# Patient Record
Sex: Female | Born: 1980 | Race: White | Hispanic: No | Marital: Married | Smoking: Never smoker
Health system: Southern US, Community
[De-identification: ages and names within clinical notes are randomized; demographics above are authoritative.]

## PROBLEM LIST (undated history)

## (undated) DIAGNOSIS — Z789 Other specified health status: Secondary | ICD-10-CM

## (undated) DIAGNOSIS — N946 Dysmenorrhea, unspecified: Secondary | ICD-10-CM

## (undated) HISTORY — DX: Dysmenorrhea, unspecified: N94.6

## (undated) HISTORY — DX: Other specified health status: Z78.9

---

## 2006-09-05 ENCOUNTER — Other Ambulatory Visit: Admission: RE | Admit: 2006-09-05 | Discharge: 2006-09-05 | Payer: Self-pay | Admitting: Obstetrics and Gynecology

## 2007-03-28 DIAGNOSIS — Z789 Other specified health status: Secondary | ICD-10-CM

## 2007-03-28 HISTORY — DX: Other specified health status: Z78.9

## 2008-03-04 ENCOUNTER — Encounter: Payer: Self-pay | Admitting: Women's Health

## 2008-03-04 ENCOUNTER — Ambulatory Visit: Payer: Self-pay | Admitting: Women's Health

## 2008-03-04 ENCOUNTER — Other Ambulatory Visit: Admission: RE | Admit: 2008-03-04 | Discharge: 2008-03-04 | Payer: Self-pay | Admitting: Obstetrics and Gynecology

## 2008-04-01 ENCOUNTER — Ambulatory Visit: Payer: Self-pay | Admitting: Obstetrics and Gynecology

## 2008-04-23 ENCOUNTER — Ambulatory Visit: Payer: Self-pay | Admitting: Obstetrics and Gynecology

## 2009-04-23 ENCOUNTER — Ambulatory Visit: Payer: Self-pay | Admitting: Women's Health

## 2009-04-23 ENCOUNTER — Other Ambulatory Visit: Admission: RE | Admit: 2009-04-23 | Discharge: 2009-04-23 | Payer: Self-pay | Admitting: Obstetrics and Gynecology

## 2009-11-11 ENCOUNTER — Ambulatory Visit: Payer: Self-pay | Admitting: Women's Health

## 2010-04-01 ENCOUNTER — Ambulatory Visit: Admit: 2010-04-01 | Payer: Self-pay | Admitting: Obstetrics and Gynecology

## 2010-04-06 ENCOUNTER — Ambulatory Visit (HOSPITAL_COMMUNITY)
Admission: RE | Admit: 2010-04-06 | Discharge: 2010-04-06 | Payer: Self-pay | Source: Home / Self Care | Attending: Obstetrics and Gynecology | Admitting: Obstetrics and Gynecology

## 2010-04-18 ENCOUNTER — Ambulatory Visit
Admission: RE | Admit: 2010-04-18 | Discharge: 2010-04-18 | Payer: Self-pay | Source: Home / Self Care | Attending: Obstetrics and Gynecology | Admitting: Obstetrics and Gynecology

## 2010-05-04 ENCOUNTER — Other Ambulatory Visit: Payer: Self-pay | Admitting: Women's Health

## 2010-05-04 ENCOUNTER — Encounter: Payer: Self-pay | Admitting: Women's Health

## 2010-05-04 ENCOUNTER — Other Ambulatory Visit (HOSPITAL_COMMUNITY)
Admission: RE | Admit: 2010-05-04 | Discharge: 2010-05-04 | Disposition: A | Discharge status: Home / Self Care | Payer: BC Managed Care – PPO | Source: Ambulatory Visit | Attending: Obstetrics and Gynecology | Admitting: Obstetrics and Gynecology

## 2010-05-04 DIAGNOSIS — Z01419 Encounter for gynecological examination (general) (routine) without abnormal findings: Secondary | ICD-10-CM

## 2010-05-04 DIAGNOSIS — Z124 Encounter for screening for malignant neoplasm of cervix: Secondary | ICD-10-CM | POA: Insufficient documentation

## 2010-06-06 ENCOUNTER — Ambulatory Visit (INDEPENDENT_AMBULATORY_CARE_PROVIDER_SITE_OTHER): Payer: BC Managed Care – PPO | Admitting: Obstetrics and Gynecology

## 2010-06-06 ENCOUNTER — Other Ambulatory Visit: Payer: BC Managed Care – PPO

## 2010-06-06 DIAGNOSIS — N949 Unspecified condition associated with female genital organs and menstrual cycle: Secondary | ICD-10-CM

## 2010-06-06 DIAGNOSIS — N8300 Follicular cyst of ovary, unspecified side: Secondary | ICD-10-CM

## 2010-06-07 ENCOUNTER — Ambulatory Visit: Payer: BC Managed Care – PPO

## 2010-06-08 ENCOUNTER — Ambulatory Visit (INDEPENDENT_AMBULATORY_CARE_PROVIDER_SITE_OTHER): Payer: BC Managed Care – PPO | Admitting: Obstetrics and Gynecology

## 2010-06-08 DIAGNOSIS — N979 Female infertility, unspecified: Secondary | ICD-10-CM

## 2010-07-06 ENCOUNTER — Ambulatory Visit (INDEPENDENT_AMBULATORY_CARE_PROVIDER_SITE_OTHER): Payer: BC Managed Care – PPO | Admitting: Obstetrics and Gynecology

## 2010-07-06 DIAGNOSIS — N979 Female infertility, unspecified: Secondary | ICD-10-CM

## 2010-07-19 ENCOUNTER — Other Ambulatory Visit (INDEPENDENT_AMBULATORY_CARE_PROVIDER_SITE_OTHER): Payer: BC Managed Care – PPO

## 2010-07-19 DIAGNOSIS — N912 Amenorrhea, unspecified: Secondary | ICD-10-CM

## 2010-07-26 ENCOUNTER — Other Ambulatory Visit (INDEPENDENT_AMBULATORY_CARE_PROVIDER_SITE_OTHER): Payer: BC Managed Care – PPO

## 2010-07-26 DIAGNOSIS — N912 Amenorrhea, unspecified: Secondary | ICD-10-CM

## 2010-08-08 ENCOUNTER — Ambulatory Visit (INDEPENDENT_AMBULATORY_CARE_PROVIDER_SITE_OTHER): Payer: BC Managed Care – PPO | Admitting: Gynecology

## 2010-08-08 DIAGNOSIS — N912 Amenorrhea, unspecified: Secondary | ICD-10-CM

## 2010-08-08 DIAGNOSIS — O200 Threatened abortion: Secondary | ICD-10-CM

## 2010-08-10 ENCOUNTER — Other Ambulatory Visit: Payer: BC Managed Care – PPO

## 2010-08-10 ENCOUNTER — Ambulatory Visit (INDEPENDENT_AMBULATORY_CARE_PROVIDER_SITE_OTHER): Payer: BC Managed Care – PPO | Admitting: Gynecology

## 2010-08-10 DIAGNOSIS — N912 Amenorrhea, unspecified: Secondary | ICD-10-CM

## 2010-08-10 DIAGNOSIS — O9989 Other specified diseases and conditions complicating pregnancy, childbirth and the puerperium: Secondary | ICD-10-CM

## 2010-08-10 DIAGNOSIS — Q519 Congenital malformation of uterus and cervix, unspecified: Secondary | ICD-10-CM

## 2010-08-18 ENCOUNTER — Ambulatory Visit: Payer: BC Managed Care – PPO | Admitting: Obstetrics and Gynecology

## 2010-08-18 ENCOUNTER — Other Ambulatory Visit: Payer: BC Managed Care – PPO

## 2010-08-24 LAB — HEPATITIS B SURFACE ANTIGEN: Hepatitis B Surface Ag: NEGATIVE

## 2010-08-24 LAB — ABO/RH: RH Type: POSITIVE

## 2010-09-02 LAB — GC/CHLAMYDIA PROBE AMP, GENITAL: Gonorrhea: NEGATIVE

## 2010-12-02 ENCOUNTER — Other Ambulatory Visit (HOSPITAL_COMMUNITY): Payer: Self-pay | Admitting: Obstetrics & Gynecology

## 2010-12-02 DIAGNOSIS — R1011 Right upper quadrant pain: Secondary | ICD-10-CM

## 2010-12-02 DIAGNOSIS — K8020 Calculus of gallbladder without cholecystitis without obstruction: Secondary | ICD-10-CM

## 2010-12-05 ENCOUNTER — Ambulatory Visit (HOSPITAL_COMMUNITY)
Admission: RE | Admit: 2010-12-05 | Discharge: 2010-12-05 | Disposition: A | Discharge status: Home / Self Care | Payer: BC Managed Care – PPO | Source: Ambulatory Visit | Attending: Obstetrics & Gynecology | Admitting: Obstetrics & Gynecology

## 2010-12-05 DIAGNOSIS — O99891 Other specified diseases and conditions complicating pregnancy: Secondary | ICD-10-CM | POA: Insufficient documentation

## 2010-12-05 DIAGNOSIS — R1011 Right upper quadrant pain: Secondary | ICD-10-CM | POA: Insufficient documentation

## 2010-12-05 DIAGNOSIS — K8020 Calculus of gallbladder without cholecystitis without obstruction: Secondary | ICD-10-CM

## 2010-12-06 ENCOUNTER — Other Ambulatory Visit (HOSPITAL_COMMUNITY): Payer: Self-pay | Admitting: Obstetrics & Gynecology

## 2010-12-06 DIAGNOSIS — O2690 Pregnancy related conditions, unspecified, unspecified trimester: Secondary | ICD-10-CM

## 2010-12-09 ENCOUNTER — Other Ambulatory Visit (HOSPITAL_COMMUNITY): Payer: BC Managed Care – PPO

## 2010-12-13 ENCOUNTER — Ambulatory Visit (HOSPITAL_COMMUNITY)
Admission: RE | Admit: 2010-12-13 | Discharge: 2010-12-13 | Disposition: A | Discharge status: Home / Self Care | Payer: BC Managed Care – PPO | Source: Ambulatory Visit | Attending: Obstetrics & Gynecology | Admitting: Obstetrics & Gynecology

## 2010-12-13 ENCOUNTER — Encounter (HOSPITAL_COMMUNITY): Payer: Self-pay

## 2010-12-13 DIAGNOSIS — Z1389 Encounter for screening for other disorder: Secondary | ICD-10-CM | POA: Insufficient documentation

## 2010-12-13 DIAGNOSIS — O2690 Pregnancy related conditions, unspecified, unspecified trimester: Secondary | ICD-10-CM

## 2010-12-13 DIAGNOSIS — O358XX0 Maternal care for other (suspected) fetal abnormality and damage, not applicable or unspecified: Secondary | ICD-10-CM | POA: Insufficient documentation

## 2010-12-13 DIAGNOSIS — Z363 Encounter for antenatal screening for malformations: Secondary | ICD-10-CM | POA: Insufficient documentation

## 2010-12-13 NOTE — Progress Notes (Signed)
Patient seen for ultrasound appointment today.  Please see [facility]OB[facility] report for details.  

## 2010-12-14 NOTE — Progress Notes (Addendum)
Genetic Counseling  High-Risk Gestation Note  Appointment Date:  12/13/2010 Referred By: Robley Fries, MD Date of Birth:  1980-08-03 Partner:  Christina Kidd    Pregnancy History: G1P0000 Estimated Date of Delivery: 03/29/11 Estimated Gestational Age: [redacted]w[redacted]d  Mrs. Christina Kidd and her husband, Mr. 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   Both family histories were reviewed and found to be contributory  for Saethre-Chotzen syndrome.  The current pregnancy was conceived with donor sperm. 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 Information regarding this child's features was not available at the time of today's visit. The donor was reportedly also tested and found to have the same mutation. According to the report the couple received, the donor "is healthy with no history of congenital defects and no visible deformities of his skull, face, fingers, or toes."  The family history was otherwise unremarkable for birth defects, mental retardation, recurrent pregnancy loss, and known genetic conditions. Further genetic counseling is warranted if more information is obtained.  Saethre Chotzen syndrome (SCS) is a genetic craniosynostosis condition with a prevalence of approximately 1 in 25,000-50,000. This particular condition may include the features of coronal synostosis (unilateral or bilateral), possibly leading to facial asymmetry and a high forehead. Many have a low frontal hairline, ptosis, widely spaced eyes, small ears, and findings of the hands and feet  including fusion of the skin between the second and third fingers and/or a broad or duplicated great toe. Less common signs of this condition include short  stature, abnormalities of the vertebra, hearing loss, and heart defects. Although most people with this condition are of normal intelligence, delayed development and learning difficulties have been reported but more so in individuals with SCS due to a gene deletion rather than intragenic mutations. Surgery to correct the fusion of the cranial sutures may be needed to prevent facial asymmetry and to prevent increased intracranial pressure. Features of SCS are variable, even within families. Incomplete penetrance has been described, where individuals with a disease causing mutation do not necessarily manifest features of the condition.      Saethre-Chotzen syndrome is typically diagnosed based on clinical findings. Mutations in the TWIST1 gene have been associated with SCS, with approximately 46%-80% of individuals with clinical features of SCS having an identifiable mutation in this gene. Many individuals with SCS also have a parent with the condition, though the condition can occur sporadically and features in an affected parent can vary from that of an affected child. SCS follows autosomal dominant inheritance, where an individual with Saethre-Chotzen syndrome has a 50% (1 in 2) chance of passing on the nonworking gene change to each offspring, thus leading to the condition. Given that the donor is reported to have a TWIST mutation, the current pregnancy has a 50% chance to have inherited this mutation and thus Saethre-Chotzen syndrome.  It was reported that the identified TWIST mutation (S140A) in the sperm donor has not been previously reported in the literature. Given this information and given that we do not have information regarding the features of the reported affected child, we are unable to predict phenotype in the case of an affected pregnancy.   We discussed the option of prenatal diagnosis via amniocentesis. The risks, benefits, and limitations  of this procedure were discussed. A risk of 1 in  200-300 was given for amniocentesis, the primary complication being spontaneous pregnancy loss. The couple declined amniocentesis, stating that they were not interested in this test given the associated risk of complications. We discussed the screening option of targeted ultrasound in the pregnancy to assess for features of craniosynostosis, such as cranial shape and spacing of the eyes. However, ultrasound cannot diagnose or rule out the presence of craniosynostosis in a pregnancy, nor does ultrasound detect all birth defects prenatally.  They understand that although the ultrasound may appear normal, the risk of anomalies cannot be completely eliminated. We discussed the option of postnatal genetic testing for the TWIST mutation and postnatal evaluation by a medical geneticist, and the couple expressed interest in this option. We discussed that Dr. Lendon Colonel is a medical geneticist with [facility] in [place] (phone: [phone] for appointments). Postnatal testing and evaluation may occur in the neonatal period in the hospital following delivery, or evaluation and testing can be performed in an outpatient setting at a later date.   We discussed that if the couple elected to pursue a future pregnancy using the same donor sperm, recurrence risk for SCS would be 50%, and prenatal diagnosis would be available via chorionic villus sampling or amniocentesis in a future pregnancy. Additionally, preimplantation genetic diagnosis is also an option for a future pregnancy conceived with the same donor sperm, meaning that embryo(s) obtained through in vitro fertilization would be tested for the S140A TWIST mutation prior to the implantation of unaffected embryo(s) into the uterus. Alternatively, we discussed that the couple could elect to pursue a future pregnancy with different donor sperm.   The patient denied exposure to environmental toxins or chemical agents.  She denied the use of alcohol,  tobacco or street drugs.  She denied significant viral illnesses during the course of her pregnancy.  Her medical and surgical history were noncontributory.    We discussed cystic fibrosis (CF) including: the features of CF, the incidence of 1 in 3300 in the Caucasian population, autosomal recessive inheritance, and the 25% chance of having a baby with CF if both parents are carriers of CF.  We also discussed the option of carrier testing including the pros and cons of carrier testing, as well as the option of prenatal testing if needed. Additionally, CF is included on the newborn screening panel in [place]. Mrs. Lusignan declined CF carrier screening at this time.   A complete obstetrical ultrasound was performed at the time of today's evaluation.  The ultrasound report is reported separately.     We counseled the patient for approximately 30 minutes regarding the above risks and available options.     Clydie Braun Kayshawn Ozburn, MS, [facility] 12/14/2010

## 2011-01-18 ENCOUNTER — Ambulatory Visit (HOSPITAL_COMMUNITY)
Admission: RE | Admit: 2011-01-18 | Discharge: 2011-01-18 | Disposition: A | Discharge status: Home / Self Care | Payer: BC Managed Care – PPO | Source: Ambulatory Visit | Attending: Obstetrics & Gynecology | Admitting: Obstetrics & Gynecology

## 2011-01-18 DIAGNOSIS — Z3689 Encounter for other specified antenatal screening: Secondary | ICD-10-CM | POA: Insufficient documentation

## 2011-01-18 DIAGNOSIS — O2690 Pregnancy related conditions, unspecified, unspecified trimester: Secondary | ICD-10-CM

## 2011-02-28 LAB — STREP B DNA PROBE: GBS: NEGATIVE

## 2011-03-18 ENCOUNTER — Encounter (HOSPITAL_COMMUNITY): Payer: Self-pay | Admitting: Obstetrics and Gynecology

## 2011-03-18 ENCOUNTER — Inpatient Hospital Stay (HOSPITAL_COMMUNITY)
Admission: AD | Admit: 2011-03-18 | Discharge: 2011-03-20 | DRG: 370 | Disposition: A | Discharge status: Home / Self Care | Payer: BC Managed Care – PPO | Source: Ambulatory Visit | Attending: Obstetrics and Gynecology | Admitting: Obstetrics and Gynecology

## 2011-03-18 ENCOUNTER — Inpatient Hospital Stay (HOSPITAL_COMMUNITY): Payer: BC Managed Care – PPO | Admitting: Anesthesiology

## 2011-03-18 ENCOUNTER — Encounter (HOSPITAL_COMMUNITY): Payer: Self-pay | Admitting: *Deleted

## 2011-03-18 ENCOUNTER — Encounter (HOSPITAL_COMMUNITY): Payer: Self-pay | Admitting: Anesthesiology

## 2011-03-18 ENCOUNTER — Encounter (HOSPITAL_COMMUNITY)
Admission: AD | Disposition: A | Discharge status: Home / Self Care | Payer: Self-pay | Source: Ambulatory Visit | Attending: Obstetrics and Gynecology

## 2011-03-18 ENCOUNTER — Other Ambulatory Visit: Payer: Self-pay | Admitting: Obstetrics & Gynecology

## 2011-03-18 DIAGNOSIS — O9903 Anemia complicating the puerperium: Secondary | ICD-10-CM | POA: Diagnosis not present

## 2011-03-18 DIAGNOSIS — D649 Anemia, unspecified: Secondary | ICD-10-CM | POA: Diagnosis not present

## 2011-03-18 DIAGNOSIS — IMO0001 Reserved for inherently not codable concepts without codable children: Secondary | ICD-10-CM

## 2011-03-18 LAB — CBC
MCH: 33.2 pg (ref 26.0–34.0)
MCHC: 35.3 g/dL (ref 30.0–36.0)
MCV: 94.2 fL (ref 78.0–100.0)
Platelets: 211 10*3/uL (ref 150–400)
RBC: 3.64 MIL/uL — ABNORMAL LOW (ref 3.87–5.11)

## 2011-03-18 LAB — RPR: RPR Ser Ql: NONREACTIVE

## 2011-03-18 SURGERY — Surgical Case
Anesthesia: Regional | Site: Abdomen | Wound class: Clean Contaminated

## 2011-03-18 MED ORDER — OXYTOCIN 20 UNITS IN LACTATED RINGERS INFUSION - SIMPLE: 125.0000 mL/h | INTRAVENOUS | Status: AC

## 2011-03-18 MED ORDER — MEPERIDINE HCL 25 MG/ML IJ SOLN
INTRAMUSCULAR | Status: DC | PRN
  Administered 2011-03-18 (×3): 6 mg via INTRAVENOUS
  Administered 2011-03-18: 7 mg via INTRAVENOUS

## 2011-03-18 MED ORDER — CEFAZOLIN SODIUM 1-5 GM-% IV SOLN
INTRAVENOUS | Status: AC
  Filled 2011-03-18: qty 50

## 2011-03-18 MED ORDER — FENTANYL CITRATE 0.05 MG/ML IJ SOLN: 25.0000 ug | INTRAMUSCULAR | Status: DC | PRN

## 2011-03-18 MED ORDER — METOCLOPRAMIDE HCL 5 MG/ML IJ SOLN: 10.0000 mg | Freq: Once | INTRAMUSCULAR | Status: DC | PRN

## 2011-03-18 MED ORDER — LIDOCAINE HCL (PF) 1 % IJ SOLN: 30.0000 mL | INTRAMUSCULAR | Status: DC | PRN

## 2011-03-18 MED ORDER — SODIUM CHLORIDE 0.9 % IJ SOLN: 3.0000 mL | Freq: Two times a day (BID) | INTRAMUSCULAR | Status: DC

## 2011-03-18 MED ORDER — IBUPROFEN 600 MG PO TABS
600.0000 mg | ORAL_TABLET | Freq: Four times a day (QID) | ORAL | Status: DC
  Administered 2011-03-19 – 2011-03-20 (×5): 600 mg via ORAL
  Filled 2011-03-18 (×2): qty 1

## 2011-03-18 MED ORDER — OXYTOCIN 10 UNIT/ML IJ SOLN: 10.0000 [IU] | Freq: Once | INTRAMUSCULAR | Status: DC

## 2011-03-18 MED ORDER — SODIUM CHLORIDE 0.9 % IR SOLN
Status: DC | PRN
  Administered 2011-03-18: 1000 mL

## 2011-03-18 MED ORDER — ONDANSETRON HCL 4 MG/2ML IJ SOLN
INTRAMUSCULAR | Status: DC | PRN
  Administered 2011-03-18: 4 mg via INTRAVENOUS

## 2011-03-18 MED ORDER — SIMETHICONE 80 MG PO CHEW
80.0000 mg | CHEWABLE_TABLET | Freq: Three times a day (TID) | ORAL | Status: DC
  Administered 2011-03-19 – 2011-03-20 (×6): 80 mg via ORAL

## 2011-03-18 MED ORDER — LIDOCAINE-EPINEPHRINE (PF) 2 %-1:200000 IJ SOLN
INTRAMUSCULAR | Status: AC
  Filled 2011-03-18: qty 20

## 2011-03-18 MED ORDER — LACTATED RINGERS IV SOLN
500.0000 mL | Freq: Once | INTRAVENOUS | Status: AC
  Administered 2011-03-18: 500 mL via INTRAVENOUS

## 2011-03-18 MED ORDER — DIBUCAINE 1 % RE OINT: 1.0000 "application " | TOPICAL_OINTMENT | RECTAL | Status: DC | PRN

## 2011-03-18 MED ORDER — DIPHENHYDRAMINE HCL 50 MG/ML IJ SOLN: 12.5000 mg | INTRAMUSCULAR | Status: DC | PRN

## 2011-03-18 MED ORDER — TERBUTALINE SULFATE 1 MG/ML IJ SOLN: 0.2500 mg | Freq: Once | INTRAMUSCULAR | Status: DC | PRN

## 2011-03-18 MED ORDER — NALBUPHINE HCL 10 MG/ML IJ SOLN
5.0000 mg | INTRAMUSCULAR | Status: DC | PRN
  Filled 2011-03-18: qty 1

## 2011-03-18 MED ORDER — ONDANSETRON HCL 4 MG/2ML IJ SOLN
INTRAMUSCULAR | Status: AC
  Filled 2011-03-18: qty 2

## 2011-03-18 MED ORDER — SIMETHICONE 80 MG PO CHEW: 80.0000 mg | CHEWABLE_TABLET | ORAL | Status: DC | PRN

## 2011-03-18 MED ORDER — EPHEDRINE 5 MG/ML INJ
10.0000 mg | INTRAVENOUS | Status: DC | PRN
  Administered 2011-03-18: 10 mg via INTRAVENOUS
  Filled 2011-03-18: qty 4

## 2011-03-18 MED ORDER — DIPHENHYDRAMINE HCL 25 MG PO CAPS: 25.0000 mg | ORAL_CAPSULE | Freq: Four times a day (QID) | ORAL | Status: DC | PRN

## 2011-03-18 MED ORDER — MORPHINE SULFATE (PF) 0.5 MG/ML IJ SOLN
INTRAMUSCULAR | Status: DC | PRN
  Administered 2011-03-18: 1 mg via INTRAVENOUS

## 2011-03-18 MED ORDER — CEFAZOLIN SODIUM 1-5 GM-% IV SOLN
INTRAVENOUS | Status: DC | PRN
  Administered 2011-03-18: 1 g via INTRAVENOUS

## 2011-03-18 MED ORDER — ZOLPIDEM TARTRATE 5 MG PO TABS: 5.0000 mg | ORAL_TABLET | Freq: Every evening | ORAL | Status: DC | PRN

## 2011-03-18 MED ORDER — CEFAZOLIN SODIUM 1-5 GM-% IV SOLN: 1.0000 g | INTRAVENOUS | Status: DC

## 2011-03-18 MED ORDER — ONDANSETRON HCL 4 MG/2ML IJ SOLN
4.0000 mg | Freq: Three times a day (TID) | INTRAMUSCULAR | Status: DC | PRN
  Administered 2011-03-19: 4 mg via INTRAVENOUS

## 2011-03-18 MED ORDER — LACTATED RINGERS IV SOLN: 500.0000 mL | INTRAVENOUS | Status: DC | PRN

## 2011-03-18 MED ORDER — WITCH HAZEL-GLYCERIN EX PADS: 1.0000 "application " | MEDICATED_PAD | CUTANEOUS | Status: DC | PRN

## 2011-03-18 MED ORDER — SODIUM CHLORIDE 0.9 % IJ SOLN: 3.0000 mL | INTRAMUSCULAR | Status: DC | PRN

## 2011-03-18 MED ORDER — SODIUM BICARBONATE 8.4 % IV SOLN
INTRAVENOUS | Status: DC | PRN
  Administered 2011-03-18: 5 mL via EPIDURAL

## 2011-03-18 MED ORDER — RISAQUAD PO CAPS
1.0000 | ORAL_CAPSULE | Freq: Every day | ORAL | Status: DC
  Administered 2011-03-18: 1 via ORAL
  Filled 2011-03-18 (×2): qty 1

## 2011-03-18 MED ORDER — OXYCODONE-ACETAMINOPHEN 5-325 MG PO TABS: 2.0000 | ORAL_TABLET | ORAL | Status: DC | PRN

## 2011-03-18 MED ORDER — MORPHINE SULFATE (PF) 0.5 MG/ML IJ SOLN
INTRAMUSCULAR | Status: DC | PRN
  Administered 2011-03-18: 4 mg via EPIDURAL

## 2011-03-18 MED ORDER — SODIUM CHLORIDE 0.9 % IV SOLN
1.0000 ug/kg/h | INTRAVENOUS | Status: DC | PRN
  Filled 2011-03-18: qty 2.5

## 2011-03-18 MED ORDER — NALOXONE HCL 0.4 MG/ML IJ SOLN: 0.4000 mg | INTRAMUSCULAR | Status: DC | PRN

## 2011-03-18 MED ORDER — ONDANSETRON HCL 4 MG/2ML IJ SOLN: 4.0000 mg | Freq: Four times a day (QID) | INTRAMUSCULAR | Status: DC | PRN

## 2011-03-18 MED ORDER — IBUPROFEN 600 MG PO TABS
600.0000 mg | ORAL_TABLET | Freq: Four times a day (QID) | ORAL | Status: DC | PRN
  Filled 2011-03-18 (×3): qty 1

## 2011-03-18 MED ORDER — OXYTOCIN 20 UNITS IN LACTATED RINGERS INFUSION - SIMPLE
1.0000 m[IU]/min | INTRAVENOUS | Status: DC
  Administered 2011-03-18: 1 m[IU]/min via INTRAVENOUS

## 2011-03-18 MED ORDER — LACTATED RINGERS IV SOLN
INTRAVENOUS | Status: DC | PRN
  Administered 2011-03-18 (×3): via INTRAVENOUS

## 2011-03-18 MED ORDER — FENTANYL 2.5 MCG/ML BUPIVACAINE 1/10 % EPIDURAL INFUSION (WH - ANES)
INTRAMUSCULAR | Status: DC | PRN
  Administered 2011-03-18: 14 mL/h via EPIDURAL

## 2011-03-18 MED ORDER — PHENYLEPHRINE 40 MCG/ML (10ML) SYRINGE FOR IV PUSH (FOR BLOOD PRESSURE SUPPORT)
80.0000 ug | PREFILLED_SYRINGE | INTRAVENOUS | Status: DC | PRN
  Filled 2011-03-18: qty 5

## 2011-03-18 MED ORDER — MORPHINE SULFATE 0.5 MG/ML IJ SOLN
INTRAMUSCULAR | Status: AC
  Filled 2011-03-18: qty 10

## 2011-03-18 MED ORDER — LACTATED RINGERS IV SOLN
INTRAVENOUS | Status: DC
  Administered 2011-03-18: 16:00:00 via INTRAVENOUS

## 2011-03-18 MED ORDER — FLEET ENEMA 7-19 GM/118ML RE ENEM: 1.0000 | ENEMA | RECTAL | Status: DC | PRN

## 2011-03-18 MED ORDER — LIDOCAINE HCL 1.5 % IJ SOLN
INTRAMUSCULAR | Status: DC | PRN
  Administered 2011-03-18 (×2): 4 mL via EPIDURAL

## 2011-03-18 MED ORDER — MEPERIDINE HCL 25 MG/ML IJ SOLN: 6.2500 mg | INTRAMUSCULAR | Status: DC | PRN

## 2011-03-18 MED ORDER — KETOROLAC TROMETHAMINE 30 MG/ML IJ SOLN: 30.0000 mg | Freq: Four times a day (QID) | INTRAMUSCULAR | Status: AC | PRN

## 2011-03-18 MED ORDER — ONDANSETRON HCL 4 MG PO TABS: 4.0000 mg | ORAL_TABLET | ORAL | Status: DC | PRN

## 2011-03-18 MED ORDER — SODIUM CHLORIDE 0.9 % IV SOLN: 250.0000 mL | INTRAVENOUS | Status: DC | PRN

## 2011-03-18 MED ORDER — PHENYLEPHRINE 40 MCG/ML (10ML) SYRINGE FOR IV PUSH (FOR BLOOD PRESSURE SUPPORT): 80.0000 ug | PREFILLED_SYRINGE | INTRAVENOUS | Status: DC | PRN

## 2011-03-18 MED ORDER — CITRIC ACID-SODIUM CITRATE 334-500 MG/5ML PO SOLN
30.0000 mL | ORAL | Status: DC | PRN
  Administered 2011-03-18: 30 mL via ORAL
  Filled 2011-03-18: qty 15

## 2011-03-18 MED ORDER — OXYTOCIN 20 UNITS IN LACTATED RINGERS INFUSION - SIMPLE
INTRAVENOUS | Status: DC | PRN
  Administered 2011-03-18 (×2): 20 [IU] via INTRAVENOUS

## 2011-03-18 MED ORDER — DIPHENHYDRAMINE HCL 25 MG PO CAPS: 25.0000 mg | ORAL_CAPSULE | ORAL | Status: DC | PRN

## 2011-03-18 MED ORDER — OXYTOCIN 20 UNITS IN LACTATED RINGERS INFUSION - SIMPLE: 125.0000 mL/h | Freq: Once | INTRAVENOUS | Status: DC

## 2011-03-18 MED ORDER — MEPERIDINE HCL 25 MG/ML IJ SOLN
INTRAMUSCULAR | Status: AC
  Filled 2011-03-18: qty 1

## 2011-03-18 MED ORDER — PANTOPRAZOLE SODIUM 20 MG PO TBEC
20.0000 mg | DELAYED_RELEASE_TABLET | Freq: Every day | ORAL | Status: DC
  Administered 2011-03-18: 20 mg via ORAL
  Filled 2011-03-18 (×2): qty 1

## 2011-03-18 MED ORDER — DIPHENHYDRAMINE HCL 50 MG/ML IJ SOLN: 25.0000 mg | INTRAMUSCULAR | Status: DC | PRN

## 2011-03-18 MED ORDER — OXYCODONE-ACETAMINOPHEN 5-325 MG PO TABS
1.0000 | ORAL_TABLET | ORAL | Status: DC | PRN
  Administered 2011-03-20: 1 via ORAL
  Filled 2011-03-18 (×2): qty 1

## 2011-03-18 MED ORDER — KETOROLAC TROMETHAMINE 30 MG/ML IJ SOLN
INTRAMUSCULAR | Status: AC
  Filled 2011-03-18: qty 1

## 2011-03-18 MED ORDER — ACETAMINOPHEN 325 MG PO TABS: 650.0000 mg | ORAL_TABLET | ORAL | Status: DC | PRN

## 2011-03-18 MED ORDER — PRENATAL MULTIVITAMIN CH
1.0000 | ORAL_TABLET | Freq: Every day | ORAL | Status: DC
  Administered 2011-03-19 – 2011-03-20 (×2): 1 via ORAL
  Filled 2011-03-18 (×2): qty 1

## 2011-03-18 MED ORDER — SODIUM BICARBONATE 8.4 % IV SOLN
INTRAVENOUS | Status: AC
  Filled 2011-03-18: qty 50

## 2011-03-18 MED ORDER — MENTHOL 3 MG MT LOZG: 1.0000 | LOZENGE | OROMUCOSAL | Status: DC | PRN

## 2011-03-18 MED ORDER — KETOROLAC TROMETHAMINE 60 MG/2ML IM SOLN
INTRAMUSCULAR | Status: AC
  Filled 2011-03-18: qty 2

## 2011-03-18 MED ORDER — LANOLIN HYDROUS EX OINT: 1.0000 "application " | TOPICAL_OINTMENT | CUTANEOUS | Status: DC | PRN

## 2011-03-18 MED ORDER — OXYTOCIN BOLUS FROM INFUSION
500.0000 mL | Freq: Once | INTRAVENOUS | Status: DC
  Filled 2011-03-18: qty 1000
  Filled 2011-03-18: qty 500

## 2011-03-18 MED ORDER — OXYTOCIN 10 UNIT/ML IJ SOLN
INTRAMUSCULAR | Status: AC
  Filled 2011-03-18: qty 4

## 2011-03-18 MED ORDER — PANTOPRAZOLE SODIUM 20 MG PO TBEC
20.0000 mg | DELAYED_RELEASE_TABLET | Freq: Every day | ORAL | Status: DC
  Administered 2011-03-20: 20 mg via ORAL
  Filled 2011-03-18 (×4): qty 1

## 2011-03-18 MED ORDER — EPHEDRINE 5 MG/ML INJ: 10.0000 mg | INTRAVENOUS | Status: DC | PRN

## 2011-03-18 MED ORDER — IBUPROFEN 600 MG PO TABS: 600.0000 mg | ORAL_TABLET | Freq: Four times a day (QID) | ORAL | Status: DC | PRN

## 2011-03-18 MED ORDER — ONDANSETRON HCL 4 MG/2ML IJ SOLN
4.0000 mg | INTRAMUSCULAR | Status: DC | PRN
  Filled 2011-03-18: qty 2

## 2011-03-18 MED ORDER — FENTANYL 2.5 MCG/ML BUPIVACAINE 1/10 % EPIDURAL INFUSION (WH - ANES)
14.0000 mL/h | INTRAMUSCULAR | Status: DC
  Filled 2011-03-18: qty 60

## 2011-03-18 MED ORDER — LACTATED RINGERS IV SOLN: INTRAVENOUS | Status: DC

## 2011-03-18 MED ORDER — METOCLOPRAMIDE HCL 5 MG/ML IJ SOLN: 10.0000 mg | Freq: Three times a day (TID) | INTRAMUSCULAR | Status: DC | PRN

## 2011-03-18 MED ORDER — SCOPOLAMINE 1 MG/3DAYS TD PT72
1.0000 | MEDICATED_PATCH | Freq: Once | TRANSDERMAL | Status: DC
  Filled 2011-03-18: qty 1

## 2011-03-18 MED ORDER — KETOROLAC TROMETHAMINE 30 MG/ML IJ SOLN
30.0000 mg | Freq: Four times a day (QID) | INTRAMUSCULAR | Status: AC | PRN
  Administered 2011-03-18: 30 mg via INTRAMUSCULAR

## 2011-03-18 MED ORDER — TETANUS-DIPHTH-ACELL PERTUSSIS 5-2.5-18.5 LF-MCG/0.5 IM SUSP
0.5000 mL | Freq: Once | INTRAMUSCULAR | Status: AC
  Administered 2011-03-19: 0.5 mL via INTRAMUSCULAR
  Filled 2011-03-18: qty 0.5

## 2011-03-18 MED ORDER — SENNOSIDES-DOCUSATE SODIUM 8.6-50 MG PO TABS: 2.0000 | ORAL_TABLET | Freq: Every day | ORAL | Status: DC

## 2011-03-18 SURGICAL SUPPLY — 41 items
APL SKNCLS STERI-STRIP NONHPOA (GAUZE/BANDAGES/DRESSINGS) ×1
BENZOIN TINCTURE PRP APPL 2/3 (GAUZE/BANDAGES/DRESSINGS) ×1 IMPLANT
CHLORAPREP W/TINT 26ML (MISCELLANEOUS) ×2 IMPLANT
CLOTH BEACON ORANGE TIMEOUT ST (SAFETY) ×2 IMPLANT
CONTAINER PREFILL 10% NBF 15ML (MISCELLANEOUS) IMPLANT
DRESSING TELFA 8X3 (GAUZE/BANDAGES/DRESSINGS) ×2 IMPLANT
DRSG COVADERM 4X10 (GAUZE/BANDAGES/DRESSINGS) ×1 IMPLANT
ELECT REM PT RETURN 9FT ADLT (ELECTROSURGICAL) ×2
ELECTRODE REM PT RTRN 9FT ADLT (ELECTROSURGICAL) ×1 IMPLANT
EXTRACTOR VACUUM KIWI (MISCELLANEOUS) IMPLANT
EXTRACTOR VACUUM M CUP 4 TUBE (SUCTIONS) IMPLANT
GAUZE SPONGE 4X4 12PLY STRL LF (GAUZE/BANDAGES/DRESSINGS) ×2 IMPLANT
GLOVE BIO SURGEON STRL SZ7 (GLOVE) ×2 IMPLANT
GLOVE BIOGEL PI IND STRL 7.0 (GLOVE) ×2 IMPLANT
GLOVE BIOGEL PI INDICATOR 7.0 (GLOVE) ×2
GOWN PREVENTION PLUS LG XLONG (DISPOSABLE) ×6 IMPLANT
KIT ABG SYR 3ML LUER SLIP (SYRINGE) IMPLANT
NDL HYPO 25X5/8 SAFETYGLIDE (NEEDLE) IMPLANT
NEEDLE HYPO 25X5/8 SAFETYGLIDE (NEEDLE) IMPLANT
NS IRRIG 1000ML POUR BTL (IV SOLUTION) ×2 IMPLANT
PACK C SECTION WH (CUSTOM PROCEDURE TRAY) ×2 IMPLANT
PAD ABD 7.5X8 STRL (GAUZE/BANDAGES/DRESSINGS) ×2 IMPLANT
RTRCTR C-SECT PINK 25CM LRG (MISCELLANEOUS) IMPLANT
SLEEVE SCD COMPRESS KNEE MED (MISCELLANEOUS) ×1 IMPLANT
STAPLER VISISTAT 35W (STAPLE) IMPLANT
STRIP CLOSURE SKIN 1/4X4 (GAUZE/BANDAGES/DRESSINGS) ×1 IMPLANT
SUT MNCRL 0 VIOLET CTX 36 (SUTURE) ×3 IMPLANT
SUT MONOCRYL 0 CTX 36 (SUTURE) ×3
SUT PLAIN 0 NONE (SUTURE) IMPLANT
SUT PLAIN 2 0 (SUTURE)
SUT PLAIN ABS 2-0 CT1 27XMFL (SUTURE) IMPLANT
SUT VIC AB 0 CT1 27 (SUTURE) ×4
SUT VIC AB 0 CT1 27XBRD ANBCTR (SUTURE) ×2 IMPLANT
SUT VIC AB 2-0 CT1 27 (SUTURE) ×4
SUT VIC AB 2-0 CT1 TAPERPNT 27 (SUTURE) ×2 IMPLANT
SUT VIC AB 4-0 KS 27 (SUTURE) IMPLANT
SUT VICRYL 0 TIES 12 18 (SUTURE) IMPLANT
TAPE CLOTH SURG 4X10 WHT LF (GAUZE/BANDAGES/DRESSINGS) ×1 IMPLANT
TOWEL OR 17X24 6PK STRL BLUE (TOWEL DISPOSABLE) ×4 IMPLANT
TRAY FOLEY CATH 14FR (SET/KITS/TRAYS/PACK) IMPLANT
WATER STERILE IRR 1000ML POUR (IV SOLUTION) ×2 IMPLANT

## 2011-03-18 NOTE — Progress Notes (Signed)
G1 at 38.4wks. Contractions since 0200. Some bloody show since yesterday. Has hx gallbladder disease during pregnancy. No stones or sludge. Has had diarrhea for 8wks and followed by Dr Loreta Ave. Vomited several times tonight which is alittle unusual.

## 2011-03-18 NOTE — Addendum Note (Signed)
Addendum  created 03/18/11 2018 by Tyrone Apple. Malen Gauze, MD   Modules edited:Orders

## 2011-03-18 NOTE — Progress Notes (Signed)
Pt presents to [facility]U with chief complaint of contractions that started at 0200. Pt says contractions have become stronger throughout night/morning. Pt is a G1 at [redacted]w[redacted]d.

## 2011-03-18 NOTE — Anesthesia Procedure Notes (Signed)
Epidural Patient location during procedure: OB Start time: 03/18/2011 1:23 PM  Staffing Anesthesiologist: Der Gagliano A. Performed by: anesthesiologist   Preanesthetic Checklist Completed: patient identified, site marked, surgical consent, pre-op evaluation, timeout performed, IV checked, risks and benefits discussed and monitors and equipment checked  Epidural Patient position: sitting Prep: site prepped and draped and DuraPrep Patient monitoring: continuous pulse ox and blood pressure Approach: midline Injection technique: LOR air  Needle:  Needle type: Tuohy  Needle gauge: 17 G Needle length: 9 cm Needle insertion depth: 5 cm cm Catheter type: closed end flexible Catheter size: 19 Gauge Catheter at skin depth: 10 cm Test dose: negative and 1.5% lidocaine  Assessment Events: blood not aspirated, injection not painful, no injection resistance, negative IV test and no paresthesia  Additional Notes Patient is more comfortable after epidural dosed. Please see RN's note for documentation of vital signs and FHR which are stable.

## 2011-03-18 NOTE — Anesthesia Preprocedure Evaluation (Addendum)
Anesthesia Evaluation  Patient identified by MRN, date of birth, ID band Patient awake    Reviewed: Allergy & Precautions, H&P , Patient's Chart, lab work & pertinent test results  Airway Mallampati: II TM Distance: >3 FB Neck ROM: full    Dental No notable dental hx. (+) Teeth Intact   Pulmonary neg pulmonary ROS,  clear to auscultation  Pulmonary exam normal       Cardiovascular neg cardio ROS regular Normal    Neuro/Psych Negative Neurological ROS  Negative Psych ROS   GI/Hepatic negative GI ROS, Neg liver ROS,   Endo/Other  Negative Endocrine ROS  Renal/GU negative Renal ROS  Genitourinary negative   Musculoskeletal   Abdominal Normal abdominal exam  (+)   Peds  Hematology negative hematology ROS (+)   Anesthesia Other Findings   Reproductive/Obstetrics (+) Pregnancy                           Anesthesia Physical Anesthesia Plan  ASA: II and Emergent  Anesthesia Plan: Epidural   Post-op Pain Management:    Induction:   Airway Management Planned:   Additional Equipment:   Intra-op Plan:   Post-operative Plan:   Informed Consent: I have reviewed the patients History and Physical, chart, labs and discussed the procedure including the risks, benefits and alternatives for the proposed anesthesia with the patient or authorized representative who has indicated his/her understanding and acceptance.     Plan Discussed with: Anesthesiologist and Surgeon  Anesthesia Plan Comments:        Anesthesia Quick Evaluation

## 2011-03-18 NOTE — Transfer of Care (Signed)
Immediate Anesthesia Transfer of Care Note  Patient: Christina Kidd  Procedure(s) Performed:  CESAREAN SECTION - Primary cesarean section with delivery of baby boy at 58. Apgars 9/9.  Patient Location: PACU  Anesthesia Type: Epidural  Level of Consciousness: awake, alert , oriented and patient cooperative  Airway & Oxygen Therapy: Patient Spontanous Breathing  Post-op Assessment: Report given to PACU RN and Post -op Vital signs reviewed and stable  Post vital signs: Reviewed and stable  Complications: No apparent anesthesia complications

## 2011-03-18 NOTE — Progress Notes (Signed)
Christina Kidd is a 30 y.o. G1P0000 at [redacted]w[redacted]d, well dated. Came in labor at 10 am (4 cm).  Sp Epidural and comfortable.   Objective: BP 128/80  Pulse 75  Temp(Src) 97.9 F (36.6 C) (Oral)  Resp 20  Ht 5\' 4"  (1.626 m)  Wt 156 lb 3.2 oz (70.852 kg)  BMI 26.81 kg/m2  SpO2 100%  LMP 06/22/2010    FHT:  FHR: 135 bpm, variability: moderate,  accelerations:  Present,  decelerations:  Absent UC:   regular, every 2-3 minutes SVE:   Dilation: 6 Effacement (%): 90 Station: Ballotable Exam by:: Mathius Birkeland I came to assess labor progress (6 cm for 2-3 hrs per RN) and AROM. Pitocin was just started to augment contractions. But head NOT in pelvis. Bedsire sono confirmed VTX at pelvic inlet and on exam head is ballotable at the pubic symphysis.  AROM NOT done due to ballotable head in G1 with risk of cord prolapse.   Labs: Lab Results  Component Value Date   WBC 7.4 03/18/2011   HGB 12.1 03/18/2011   HCT 34.3* 03/18/2011   MCV 94.2 03/18/2011   PLT 211 03/18/2011    Assessment / Plan: 38.4/7 wks in active labor, head ballotable, vtx by sono, 6 cm with BBOW. Will not AROM due to cord prolapse risk. Plan to proceed with cesarean delivery, risks/complications and future adhesions. csections reviewed,, she understands and agrees.  At present case in OR just started, hence will watch closely with EFM, off pitocin and see if 2nd OR team can arrive soon.  NICU at delivery to assess if baby syndromic.   Lester Crickenberger R 03/18/2011, 4:53 PM

## 2011-03-18 NOTE — Op Note (Signed)
Primary Low Transverse Cesarean Section Procedure Note -Christina Kidd; 03/18/2011  Indications: G1 at 38.4/7 wks, spontaneous active labor, s/p epidural and was progressing well until 6 cm for several hours. AROM attempt made, but station high (ballotable) and bedside ultrasound needed to assess fetal presentation which was cephalic.  Pre-operative Diagnosis: 38.4/7 wks, labor, unengaged floating head. Post-operative Diagnosis: Same  Surgeon: Surgeon(s) and Role: Robley Fries - Primary  Assistants: None  Anesthesia: Epidural    Procedure Details:  The patient was seen in the labor room and evaluated. The risks, benefits, complications, treatment options, and expected outcomes were discussed with the patient. The patient concurred with the proposed plan, giving informed consent. identified as Don Broach and the procedure verified as C-Section Delivery. She was brought to the OR with IV running and foley on place. A Time Out was held and the above information confirmed. She received 1 gm Ancef.  After induction of anesthesia, the patient was draped and prepped in the usual sterile manner. A Pfannenstiel Incision was made and carried down through the subcutaneous tissue to the fascia. Fascial incision was made and extended transversely. The fascia was separated from the underlying rectus tissue superiorly and inferiorly. The peritoneum was identified and entered. Peritoneal incision was extended longitudinally. The utero-vesical peritoneal reflection was incised transversely and then a low transverse uterine incision was made. Delivered from cephalic presentation (OP and large caput) was a healthy Female infant wt 7 lb 3 oz with Apgar scores of 9 and 9 at 1 and 5 minutes. The umbilical cord was clamped and cut cord blood was obtained for evaluation and baby handed off to NICU team. The placenta was removed Intact and appeared normal. The uterine outline, tubes and ovaries appeared normal}. The  uterine incision was closed with running locked sutures of 0Vicryl followed by second imbricating layer.  Hemostasis was observed. Lavage was carried out until clear. Peritoneum was closed using 2-0 Vicryl and muscles approximated in midline. The fascia was then reapproximated with running sutures of 0Vicryl. The subcuticular closure was performed using 3-0Vicryl. Steristrips applied, pressure dressing placed.  Instrument, sponge, and needle counts were correct prior the abdominal closure and were correct at the conclusion of the case.   Findings: Female infant, cephalic, OP with caput, delivered at 1827 hrs, APGARS 9/9. Wt 7 lb 3 oz. Baby appears normal with no syndromic features, will get genetic testing later. Both tubes and ovaries normal. Placenta normal.   Estimated Blood Loss: 600 cc   Total IV Fluids: 2300 cc LR Urine Output: 100 cc clear Specimens: Cord blood and placenta.   Complications: no complications Disposition: PACU - hemodynamically stable.  Maternal Condition: stable  Baby condition / location:  nursery-stable  V.Juliene Pina, MD Signed: Surgeon(s): Robley Fries

## 2011-03-18 NOTE — OR Nursing (Signed)
Uterus massaged by S. Georgine Wiltse Charity fundraiser. Two tubes of cord blood to lab. Foley in upon arrival to OR. Urine color-sun yellow.

## 2011-03-18 NOTE — H&P (Signed)
  OB ADMISSION/ HISTORY & PHYSICAL:  Admission Date: 03/18/2011  7:04 AM  Admit Diagnosis: 1. Intrauterine pregnancy at term 2. Spontaneous labor  Christina Kidd is a 30 y.o. female at [redacted]w[redacted]d presenting for contractions. Onset ctx at 0200, lost mucus plug yesterday, (+) show today, no LOF. Onset of loose frequent stools yesterday. Denies N/V. Coping well w/ ctx's. Initial exam by RN 2/50/-2, posterior. Was FT in office 2 days ago.   Prenatal History: G1P0000   EDC : 03/28/2011, by early sono Prenatal care at [facility] since [redacted] weeks gestation  Prenatal course complicated by : -AID conception, female factor infertility.  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 MFM sono w/ nl anatomy findings, to F/U after delivery with peds. -GI problems onset during pregnancy - suspect IBS, gallbladder, nl CMP, for F/U GI consult after delivery  Prenatal Labs: ABO, Rh:   O pos Antibody:  neg Rubella:   imm RPR:   neg HBsAg:   neg HIV:   neg GBS:   neg 1 hr Glucola : 98   Medical / Surgical History :  Past medical history:  Past Medical History  Diagnosis Date  . Dysmenorrhea   . Rubella immune 2009     Past surgical history: No past surgical history on file.   Family History:  Family History  Problem Relation Age of Onset  . Hypertension Mother   . Hypertension Father   . Uterine cancer Paternal Grandmother   . Lung cancer Paternal Grandfather      Social History:  reports that she has never smoked. She does not have any smokeless tobacco history on file. She reports that she does not drink alcohol or use illicit drugs.   Allergies: Review of patient's allergies indicates no known allergies.    Current Medications at time of admission:  PNV, Prevacid, acidophilus    Review of Systems: As noted.  Physical Exam: AAOX3, pleasant, breathing some w/ contractions,  otherwise comfortable CV: RRR Pulm: CTAB Abd; gravid, NT, EFW 6.5-7 Genitalia / VE: 4/80/-2, (+) show on exam glove, vertex confirmed w/ BS sono  Dilation: 4 Effacement (%): 80 Station: -2 Exam by:: Confirmed presentation by bedside US done by D. Paul CNM  Filed Vitals:   03/18/11 0711 03/18/11 1032 03/18/11 1152  BP: 127/79 136/80 95/67  Pulse: 85 75 76  Temp: 98.6 F (37 C) 98.2 F (36.8 C)   TempSrc: Oral Oral   Resp: 20 20 20   Height: 5\' 4"  (1.626 m)    Weight: 70.852 kg (156 lb 3.2 oz)       FHR: 140 bpm, moderate variability, (+) accel's, no decel's TOCO: ctx's q 4 min, mild/mod     Assessment: IUP at term Labor with active cervical change, coping well with labor GBS neg Fetus at risk for Hopewell syndrome  Plan:  Ad[facility] to [facility] Expectant management Routine L&D orders, intermittent EFM, activity ad lib with freedom of movement Peds at del, to assess for SCS Dr. Juliene Pina updated, MD to follow  PAUL,DANIELA 03/18/2011, 10:00 AM  Addendum - Pt reviewed, agree w note and plan. Watch descent/

## 2011-03-18 NOTE — Consult Note (Signed)
Requested to attend this term gestation primary C/S at 38 weeks 4 days for failure to descend.  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 Repeated obstetric ultrasounds have not shown any anatomic abnormality.   At delivery infant in vertex presentation and was manually extracted without any difficulty. There were  spontaneous cries and active muscle tone evident. Placed under radiant warmer, infant had a normal overall appearance . There was no webbing of fingers or toes and the facial appearance was normal including philtrim, eyebrows, eyelashes. Cranial sutures palpated and all seemed to be patent, no elevated ridge noted. This information is shared with parents.   Infant was given tactile stimulation with drying and bulb suction of naso/oropharynx and responded appropriately.  Apgar scores 9/9 @ 1 & 5 minutes. Shown to mother and allowed to remain in OR with L/D RN responsible for infant's care and transport to Transitional Nursery.   Care to Dr. Dareen Piano with [facility].    Dagoberto Ligas MD [facility]

## 2011-03-18 NOTE — Anesthesia Postprocedure Evaluation (Signed)
  Anesthesia Post-op Note  Patient: Christina Kidd  Procedure(s) Performed:  CESAREAN SECTION - Primary cesarean section with delivery of baby boy at 86. Apgars 9/9.  Patient Location: PACU  Anesthesia Type: Epidural  Level of Consciousness: awake, alert  and oriented  Airway and Oxygen Therapy: Patient Spontanous Breathing  Post-op Pain: none  Post-op Assessment: Post-op Vital signs reviewed, Patient's Cardiovascular Status Stable, Respiratory Function Stable, Patent Airway, No signs of Nausea or vomiting, Pain level controlled, No headache, No backache and No residual motor weakness  Post-op Vital Signs: Reviewed and stable  Complications: No apparent anesthesia complications

## 2011-03-19 LAB — CBC
MCH: 33 pg (ref 26.0–34.0)
MCV: 94.4 fL (ref 78.0–100.0)
Platelets: 151 10*3/uL (ref 150–400)
RBC: 2.88 MIL/uL — ABNORMAL LOW (ref 3.87–5.11)
RDW: 13.1 % (ref 11.5–15.5)
WBC: 9.8 10*3/uL (ref 4.0–10.5)

## 2011-03-19 NOTE — Addendum Note (Signed)
Addendum  created 03/19/11 0914 by Edison Pace, CRNA   Modules edited:Notes Section

## 2011-03-19 NOTE — Anesthesia Postprocedure Evaluation (Signed)
  Anesthesia Post-op Note  Patient: Christina Kidd  Procedure(s) Performed:  CESAREAN SECTION - Primary cesarean section with delivery of baby boy at 42. Apgars 9/9.  Patient Location: Mother/Baby  Anesthesia Type: Epidural  Level of Consciousness: awake, alert  and oriented  Airway and Oxygen Therapy: Patient Spontanous Breathing  Post-op Assessment: Patient's Cardiovascular Status Stable and Respiratory Function Stable  Post-op Vital Signs: Reviewed and stable  Complications: No apparent anesthesia complications

## 2011-03-19 NOTE — Progress Notes (Signed)
Subjective: POD# 1 Information for the patient's newborn:  Christina, Kidd [[id number]]  female  / circ planned  Reports feeling well, but tired, sore abdomen Feeding: breast Patient reports N/V since labor, now controlled w/ meds, has tolerated clear liquids in past 2 hours, no solids yet. Pain controlled withToradol Denies HA/SOB/C/P/N/V/dizziness. Flatus absent. She reports vaginal bleeding as normal, without clots.  She is ambulating, no void yet, foley cath recently removed.     Objective:   VS:  Filed Vitals:   03/19/11 0200 03/19/11 0400 03/19/11 0500 03/19/11 0600  BP: 120/80 140/80  115/74  Pulse: 72 78  84  Temp: 98.3 F (36.8 C) 99.3 F (37.4 C)  98.3 F (36.8 C)  TempSrc:      Resp: 18 18  18   Height:      Weight:      SpO2: 97% 97% 97% 97%     Intake/Output Summary (Last 24 hours) at 03/19/11 0842 Last data filed at 03/19/11 0600  Gross per 24 hour  Intake   2600 ml  Output   1450 ml  Net   1150 ml        Basename 03/19/11 0536 03/18/11 1040  WBC 9.8 7.4  HGB 9.5* 12.1  HCT 27.2* 34.3*  PLT 151 211     Blood type: O/Positive/-- (05/30 0000)  Rubella: Immune (05/30 0000)     Physical Exam:  General: alert, cooperative, no distress, pale and facial swelling CV: Regular rate and rhythm Resp: clear Abdomen: soft, nontender, hypoactive bowel sounds Incision: clean, dry, intact and dressing in place Uterine Fundus: firm, below umbilicus, nontender Lochia: minimal Ext: extremities normal, atraumatic, no cyanosis or edema and Homans sign is negative, no sign of DVT      Assessment/Plan: 30 y.o.  status post Cesarean section. POD# 1.  s/p Cesarean Delivery.  Indications: failure to progress                Principal Problem:  *PP care, s/p C/S 12/22  Doing well, stable.    ABL anemia - mild  Start oral FE and colace once flatus present         Regular diet  Ambulate, warm PO fluids  Routine post-op care   Christina Kidd 03/19/2011, 8:42  AM

## 2011-03-20 MED ORDER — POLYSACCHARIDE IRON COMPLEX 150 MG PO CAPS: 150.0000 mg | ORAL_CAPSULE | Freq: Every day | ORAL | Status: AC

## 2011-03-20 MED ORDER — OXYCODONE-ACETAMINOPHEN 5-325 MG PO TABS: 1.0000 | ORAL_TABLET | ORAL | Status: AC | PRN

## 2011-03-20 MED ORDER — IBUPROFEN 600 MG PO TABS: 600.0000 mg | ORAL_TABLET | Freq: Four times a day (QID) | ORAL | Status: AC | PRN

## 2011-03-20 NOTE — Discharge Summary (Signed)
POSTOPERATIVE DISCHARGE SUMMARY:  Patient ID: Christina Kidd MRN: [id number] DOB/AGE: 22-Feb-1981 30 y.o.  Admit date: 03/18/2011 Discharge date: 03/20/2011    Admission Diagnoses: 1. intrauterine pregnancy at term, assisted re 2.spontaneous labor 3. Fetus at risk for Saethre-Chotzen syndrome  Discharge Diagnoses:   1. Term pregnancy - delivered, s/p cesarean section for arrest of dilation.  2. Maternal anemia - mild, asymptomatic  Prenatal history: G1P1001   EDC : 03/28/2011, by sono  Prenatal care at [facility] since [redacted] weeks gestation  Prenatal course complicated by: -AID conception, female factor infertility.  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 MFM sono w/ nl anatomy findings, to F/U after delivery with peds.  -GI problems onset during pregnancy - suspect IBS, gallbladder, nl CMP, for F/U GI consult after delivery   Prenatal Labs: ABO, Rh: O (05/30 0000)  Antibody: Negative (05/30 0000) Rubella: Immune (05/30 0000)  RPR: NON REACTIVE (12/22 1040)  HBsAg: Negative (05/30 0000)  HIV: Non-reactive (05/30 0000)  GBS: Negative (12/04 0000)  1 hr Glucola : 98   Medical / Surgical History :  Past medical history:  Past Medical History  Diagnosis Date  . Dysmenorrhea   . Rubella immune 2009  . PP care, s/p C/S 12/22 03/18/2011    Past surgical history: No past surgical history on file.  Family History:  Family History  Problem Relation Age of Onset  . Hypertension Mother   . Hypertension Father   . Uterine cancer Paternal Grandmother   . Lung cancer Paternal Grandfather     Social History:  reports that she has never smoked. She does not have any smokeless tobacco history on file. She reports that she does not drink alcohol or use illicit drugs.   Allergies: Review of patient's allergies indicates no known allergies.    Current Medications at time of  admission:   prenatal vitamins, Prevacid, acidophilus  Intrapartum Course:  Patient was admitted for spontaneous labor, normal progress to 6 cm dlation, and epidural given with effective pain relief. At 6 cm dilation for several hours. AROM attempt made, but station high (ballotable) and bedside ultrasound needed to assess fetal presentation which was cephalic. Given unengaged head despite Pitocin augmentation, AROM not done. Patient was counseled for cesarean section.    Procedures: Cesarean section delivery of female newborn by Dr Juliene Pina. Infant underwent circumcision during hospital stay. No syndromic features noted, will follow up with pediatric services. See operative report for further details  Postoperative / postpartum course:   Uneventful, mild maternal anemia managed with oral iron.   Physical Exam:  VSS:  Filed Vitals:   03/19/11 1158 03/19/11 1600 03/19/11 2050 03/20/11 0624  BP: 112/62 134/77 116/73 119/77  Pulse: 66 67 82 89  Temp: 98 F (36.7 C) 98.1 F (36.7 C) 98.3 F (36.8 C) 98.4 F (36.9 C)  TempSrc: Axillary Oral Tympanic Oral  Resp: 20 20 18 18   Height:      Weight:      SpO2:  98% 99%    Admit labs: wbc  7.4, hgb 12.1, hct 34.3, plt 211  LABS:  Lab Results  Component Value Date   WBC 9.8 03/19/2011   HGB 9.5* 03/19/2011   HCT 27.2* 03/19/2011   MCV 94.4 03/19/2011   PLT 151 03/19/2011     I&O: I/O last 3 completed shifts: In: 800 [I.V.:800] Out: 1200 [Urine:1200]      Incision:  approximated with suture  and steri strips / no erythema / no ecchymosis / no drainage   Discharge Instructions:  Discharged Condition: good Activity: pelvic rest and weight lifting restrictions x 2 weeks Diet: routine Medications: PNV, Ibuprofen, Iron and Percocet Current Discharge Medication List    START taking these medications   Details  ibuprofen (ADVIL,MOTRIN) 600 MG tablet Take 1 tablet (600 mg total) by mouth every 6 (six) hours as needed for pain. Qty:  60 tablet, Refills: 0    iron polysaccharides (NU-IRON) 150 MG capsule Take 1 capsule (150 mg total) by mouth daily. Qty: 30 capsule, Refills: 3    oxyCODONE-acetaminophen (PERCOCET) 5-325 MG per tablet Take 1-2 tablets by mouth every 3 (three) hours as needed (moderate - severe pain). Qty: 30 tablet, Refills: 0      CONTINUE these medications which have NOT CHANGED   Details  acidophilus (RISAQUAD) CAPS Take 2 capsules by mouth daily.      lansoprazole (PREVACID) 15 MG capsule Take 15 mg by mouth daily.      Prenat w/o A-FeCbn-DSS-FA-DHA (CITRANATAL HARMONY) 30-1-260 MG CAPS     Prenatal Vit-Fe Fumarate-FA (PRENATAL MULTIVITAMIN) TABS Take 1 tablet by mouth daily.         Condition: stable Postpartum Instructions: refer to practice specific booklet Discharge to: home Disposition: discharge to home Follow up :  Follow-up Information    Follow up with MODY,VAISHALI R in 6 weeks.   Contact information:   [address] [place] [address] [phone]           Signed: Arlan Organ 03/20/2011, 10:40 AM

## 2011-03-20 NOTE — Anesthesia Postprocedure Evaluation (Signed)
  Anesthesia Post-op Note  Patient: Christina Kidd  Procedure(s) Performed:  CESAREAN SECTION - Primary cesarean section with delivery of baby boy at 31. Apgars 9/9.  Patient Location: 124  Anesthesia Type: Epidural  Level of Consciousness: awake, alert  and oriented  Airway and Oxygen Therapy: Patient Spontanous Breathing  Post-op Pain: mild  Post-op Assessment: Post-op Vital signs reviewed, Patient's Cardiovascular Status Stable, No headache, No backache, No residual numbness and No residual motor weakness  Post-op Vital Signs: Reviewed and stable  Complications: No apparent anesthesia complications

## 2011-03-20 NOTE — Progress Notes (Signed)
Subjective: POD# 2 Information for the patient's newborn:  Jermya, Dowding [[id number]]  female  / circ planned  Reports feeling well, desires early D/C. Feeding: breast Patient reports tolerating PO.  Breast symptoms: none Pain controlled with Motrin and Percocet Denies HA/SOB/C/P/N/V/dizziness. Flatus present, (+) BM. She reports vaginal bleeding as normal, without clots.  She is ambulating, urinating without difficult.     Objective:   VS:  Filed Vitals:   03/19/11 1158 03/19/11 1600 03/19/11 2050 03/20/11 0624  BP: 112/62 134/77 116/73 119/77  Pulse: 66 67 82 89  Temp: 98 F (36.7 C) 98.1 F (36.7 C) 98.3 F (36.8 C) 98.4 F (36.9 C)  TempSrc: Axillary Oral Tympanic Oral  Resp: 20 20 18 18   Height:      Weight:      SpO2:  98% 99%     No intake or output data in the 24 hours ending 03/20/11 1009      Basename 03/19/11 0536 03/18/11 1040  WBC 9.8 7.4  HGB 9.5* 12.1  HCT 27.2* 34.3*  PLT 151 211     Blood type: O/Positive/-- (05/30 0000)  Rubella: Immune (05/30 0000)     Physical Exam:  General: alert, cooperative and no distress CV: Regular rate and rhythm Resp: clear Abdomen: soft, nontender, normal bowel sounds Incision: clean, dry, intact and sutures and steristrips in place Uterine Fundus: firm, below umbilicus, nontender Lochia: minimal Ext: extremities normal, atraumatic, no cyanosis or edema and Homans sign is negative, no sign of DVT      Assessment/Plan: 30 y.o.  status post Cesarean section. POD# 2.  s/p Cesarean Delivery.  Indications: failure to progress                Principal Problem:  *PP care, s/p C/S 12/22  Doing well, stable.     Routine post-op care  Maternal anemia - mild   Start oral iron  D/C home after infant circ, MD on call notified of circ pending   Maveryck Bahri 03/20/2011, 10:09 AM

## 2011-03-20 NOTE — Progress Notes (Signed)
Pt verbalizes that Ibuprofen helps her pain some, but is not the best at controlling her pain.  Offered patient percocet to take for pain control, but refuses.  Educated about pain control, still does not want to try percocet.

## 2011-03-20 NOTE — Addendum Note (Signed)
Addendum  created 03/20/11 0830 by Karleen Dolphin   Modules edited:Notes Section

## 2011-03-21 NOTE — Discharge Summary (Signed)
Reviewed, agree 

## 2011-03-23 ENCOUNTER — Encounter (HOSPITAL_COMMUNITY): Payer: Self-pay | Admitting: Obstetrics & Gynecology

## 2012-11-12 IMAGING — US US ABDOMEN LIMITED
1 series · 14 of 25 positions shown · non-contrast
Comparison: None.

CLINICAL DATA: 23+ weeks pregnant.  Right upper quadrant pain.
Rule out gallstones.

LIMITED ABDOMINAL ULTRASOUND - RIGHT UPPER QUADRANT

[Series 1: us abdomen limited · 14 of 46 slices shown]
[im 1/46]
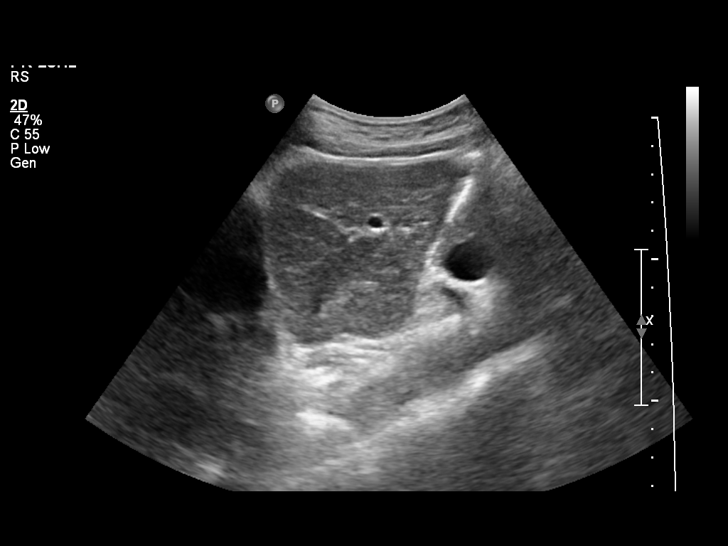
[im 4/46]
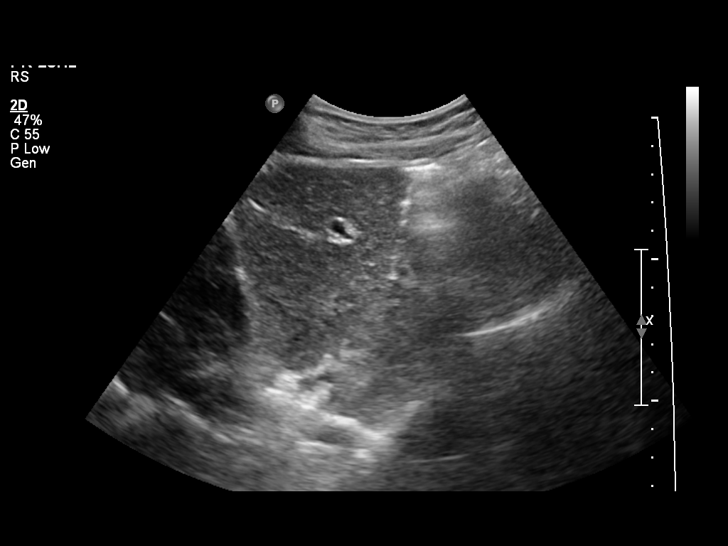
[im 8/46]
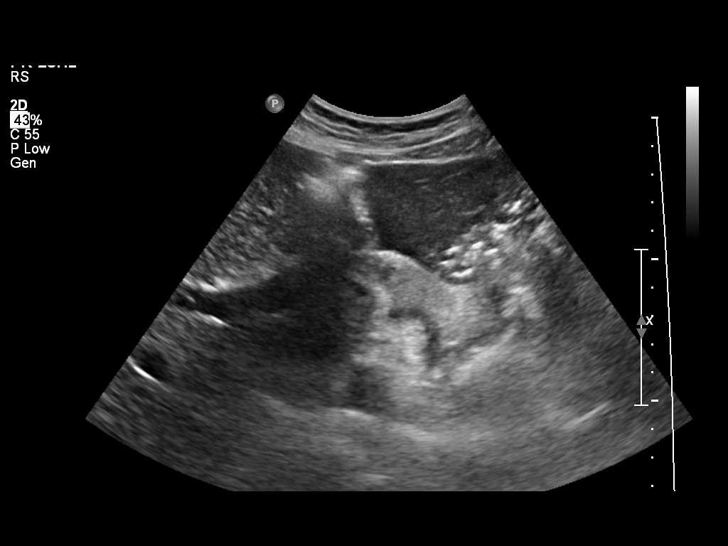
[im 12/46]
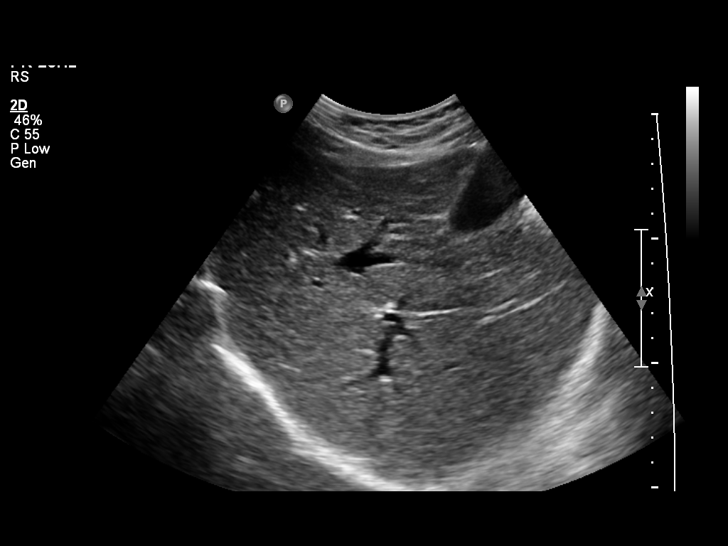
[im 16/46]
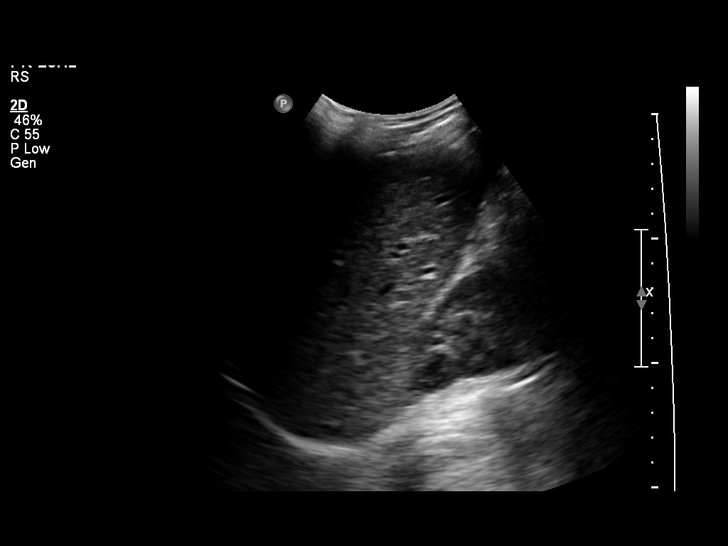
[im 17/46]
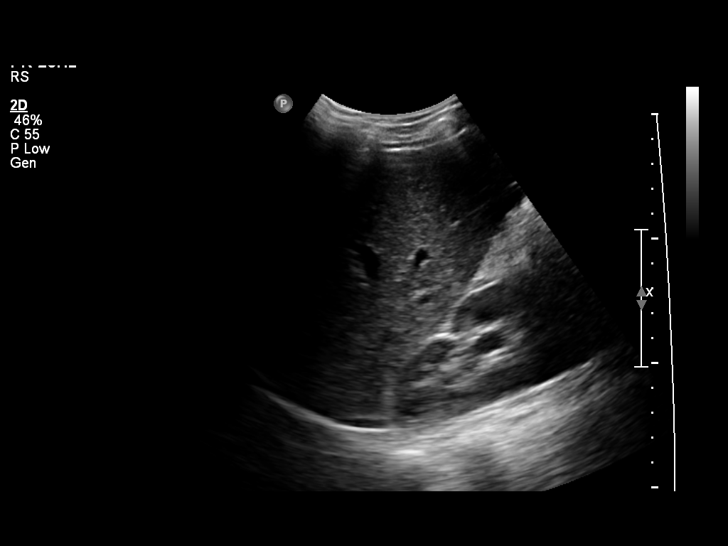
[im 21/46]
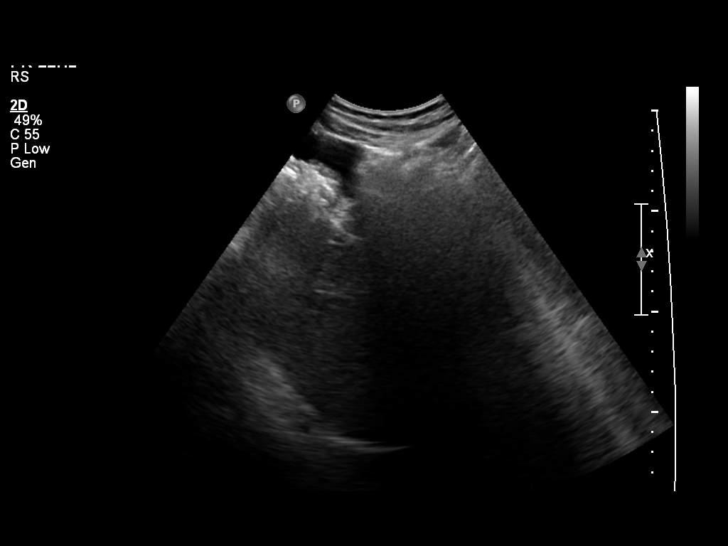
[im 25/46]
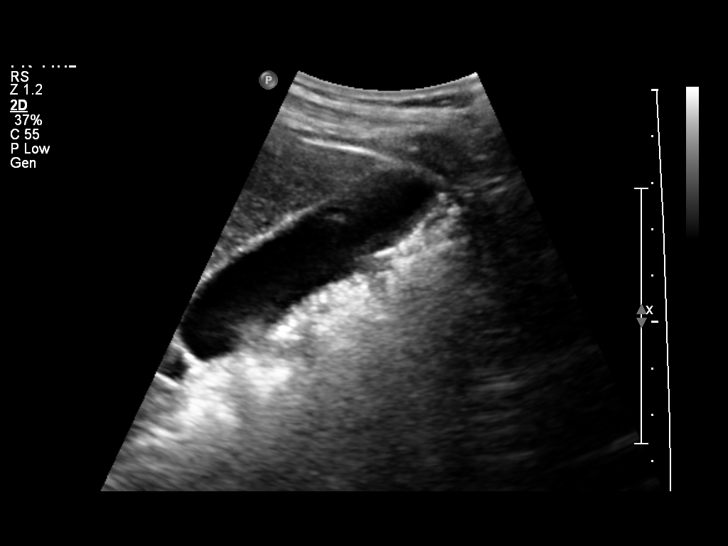
[im 29/46]
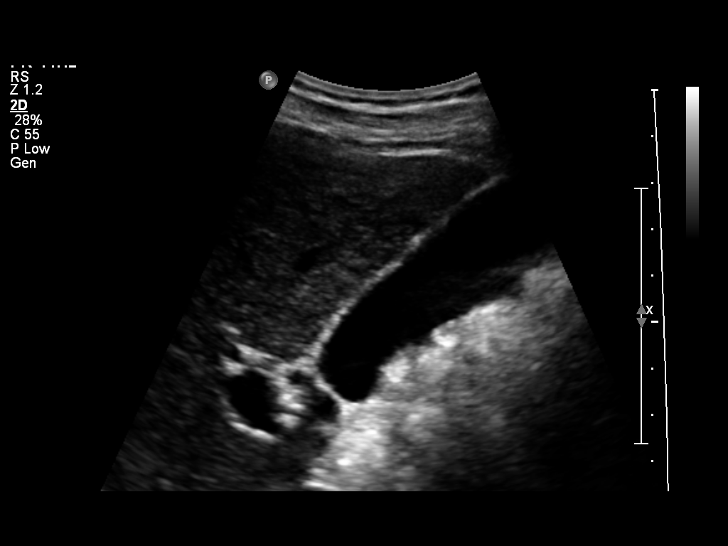
[im 31/46]
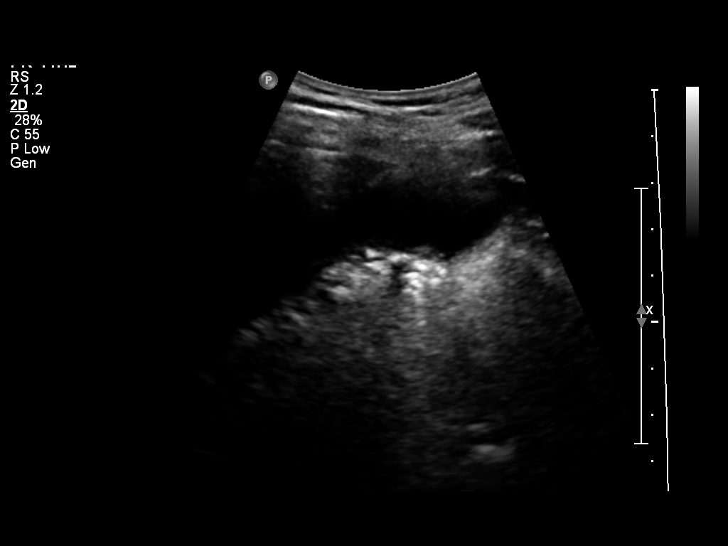
[im 34/46]
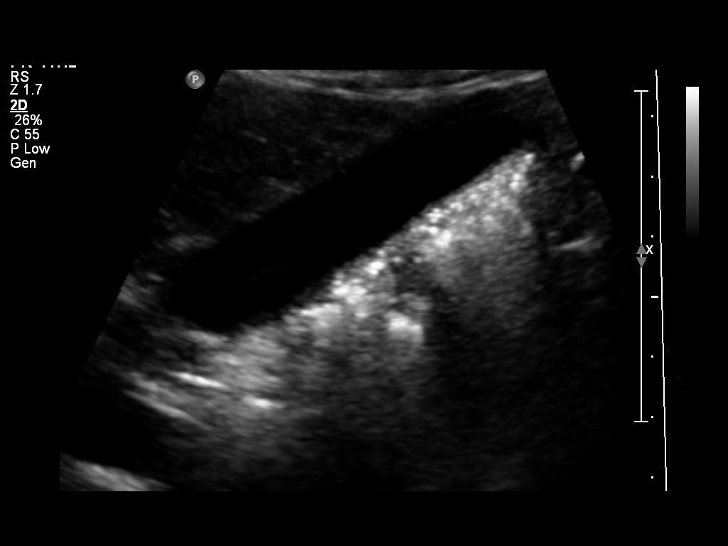
[im 38/46]
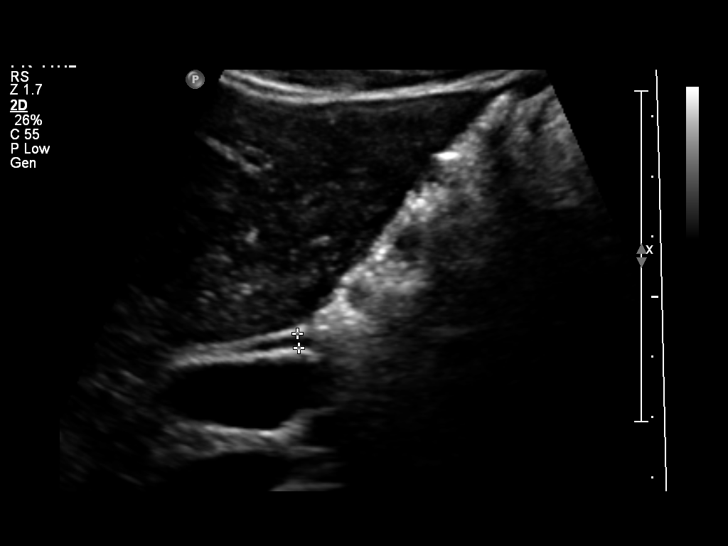
[im 42/46]
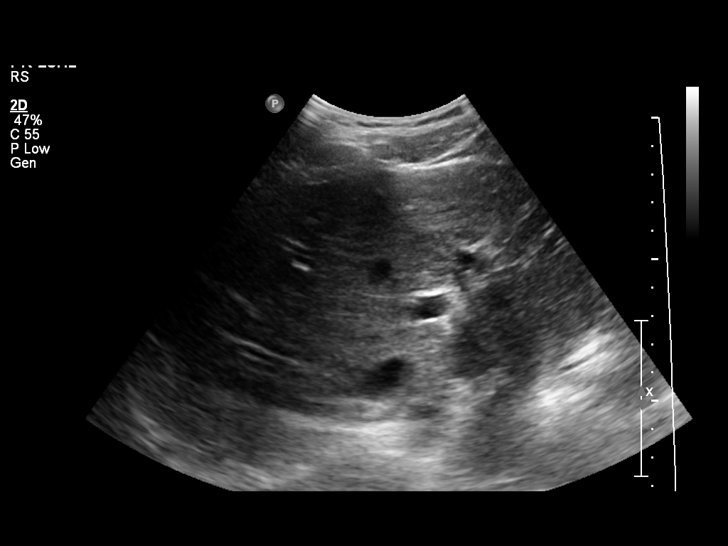
[im 46/46]
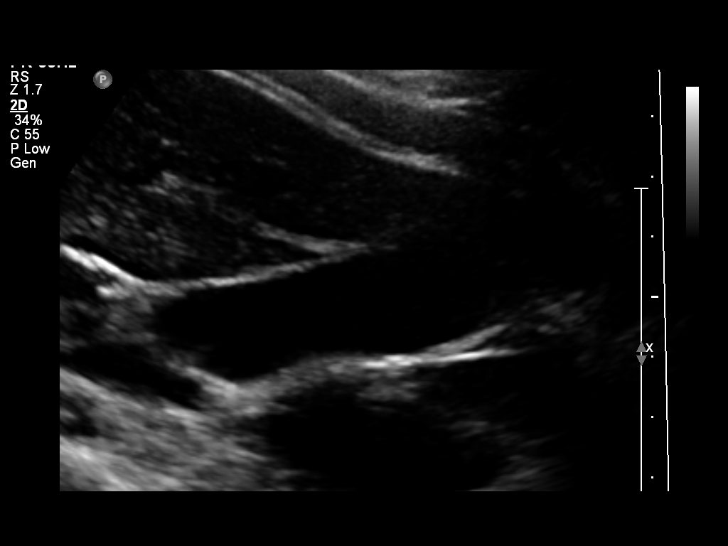

[14 of 25 positions shown; findings below may reference images not displayed]

FINDINGS: Gallbladder:  No gallstones identified.  Gallbladder wall is normal
in thickness, 0.2 mm.  No sonographic Murphy's sign.

Common bile duct:  Common bile duct is 0.2 cm.

Liver:  The liver is homogeneous in echotexture.  No focal mass.
IMPRESSION: Negative exam.

## 2012-11-20 IMAGING — US US OB DETAIL+14 WK
1 series · 14 of 28 positions shown · non-contrast
Comparison: none

[Series 1: us ob detail+14 wk · 14 of 73 slices shown]
[im 3/73]
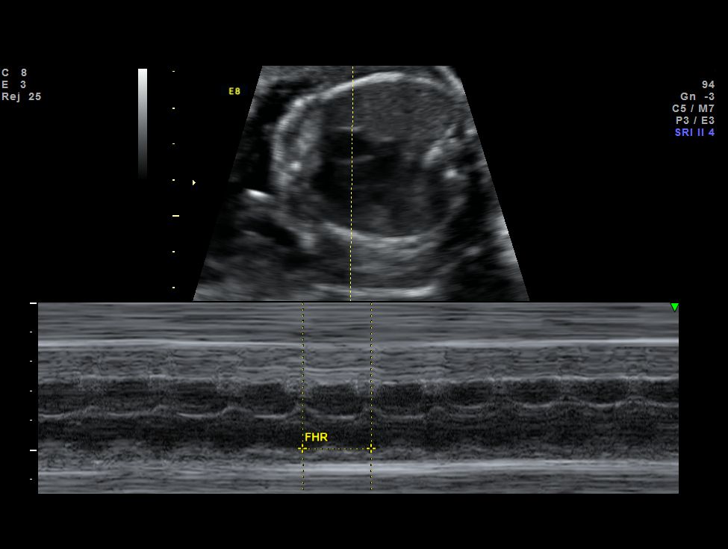
[im 9/73]
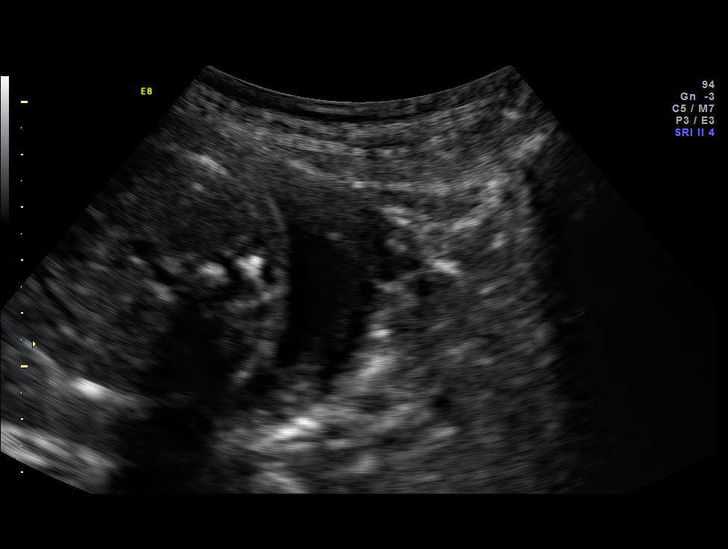
[im 14/73]
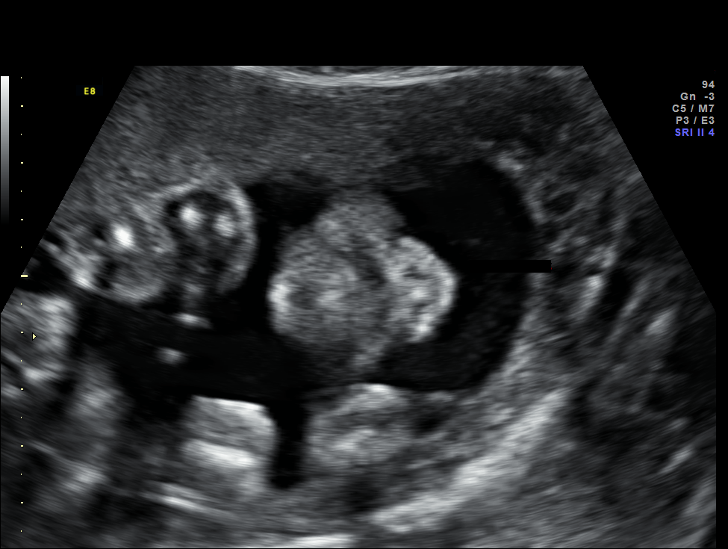
[im 19/73]
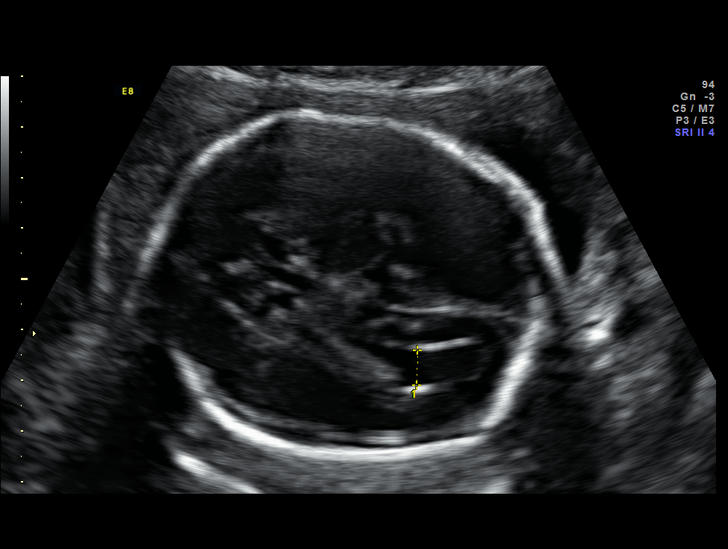
[im 25/73]
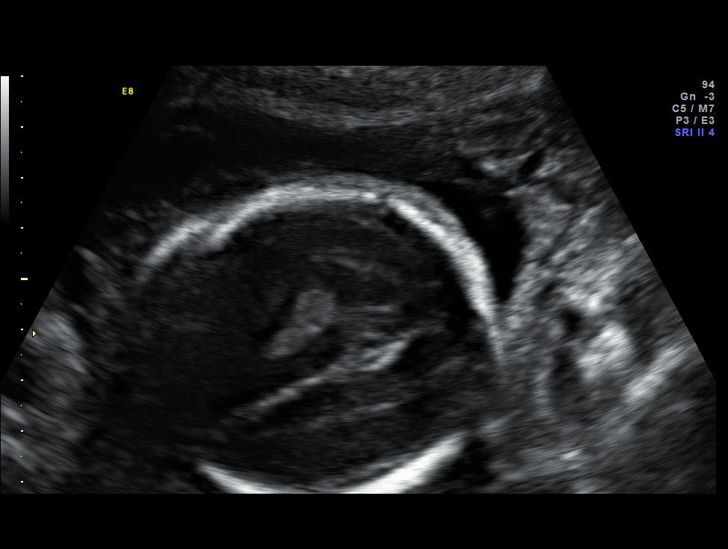
[im 30/73]
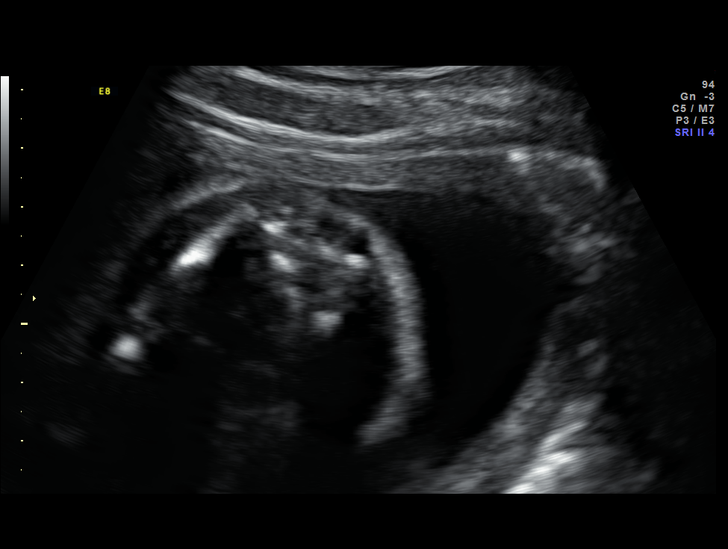
[im 35/73]
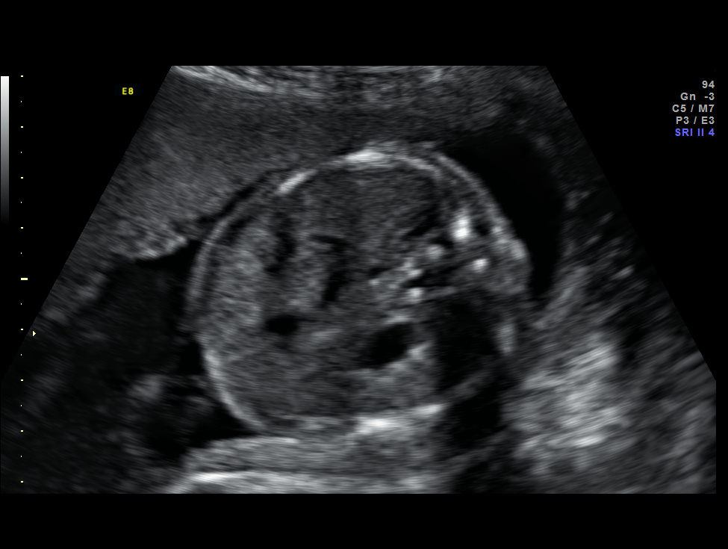
[im 41/73]
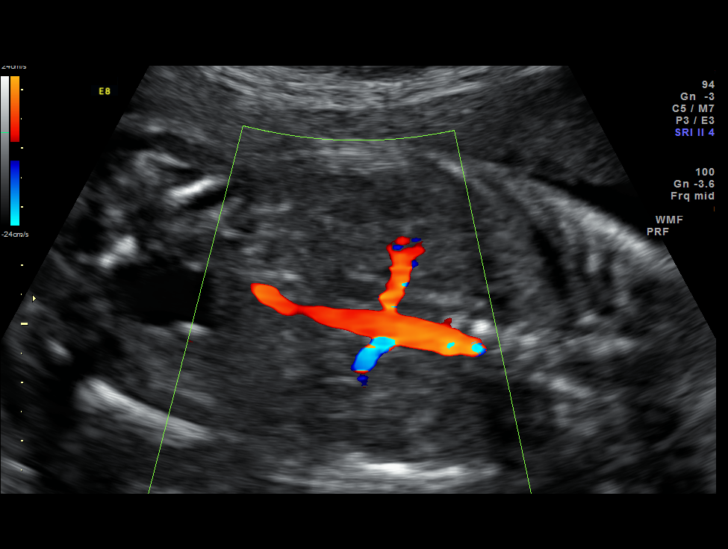
[im 46/73]
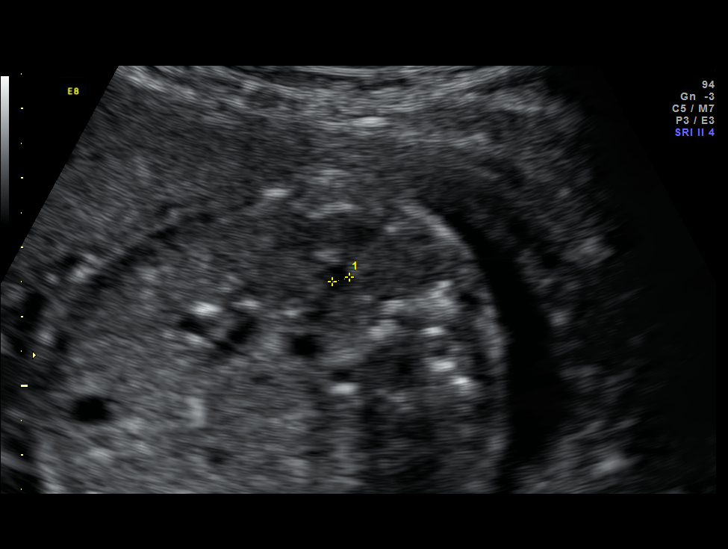
[im 51/73]
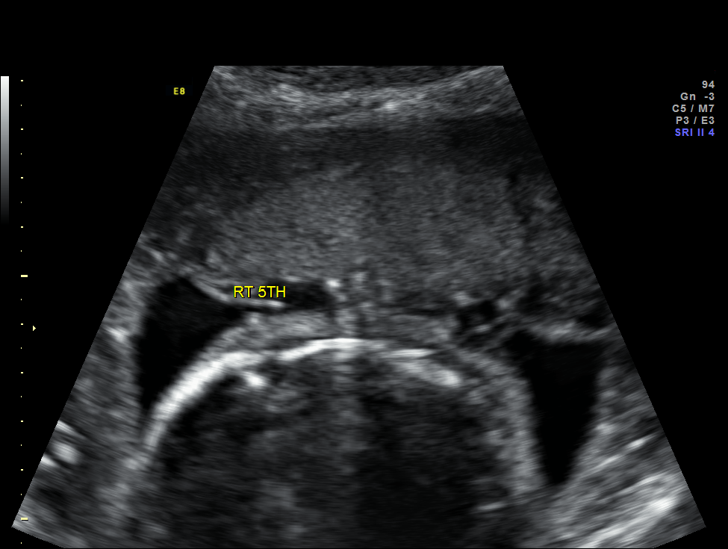
[im 57/73]
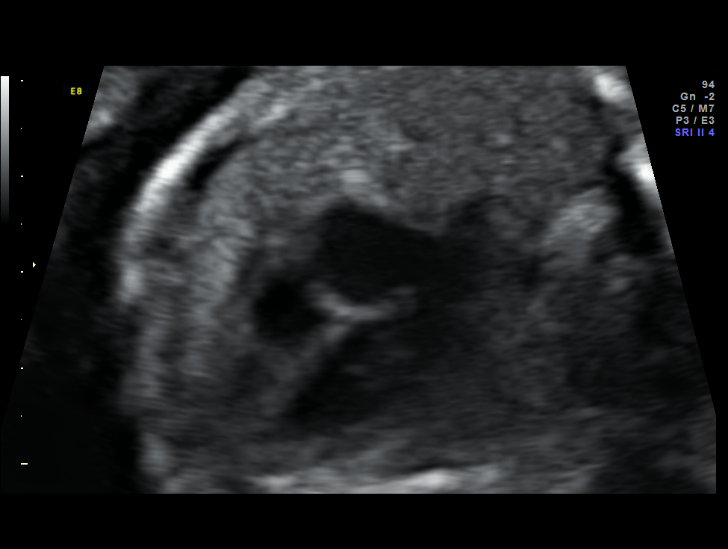
[im 62/73]
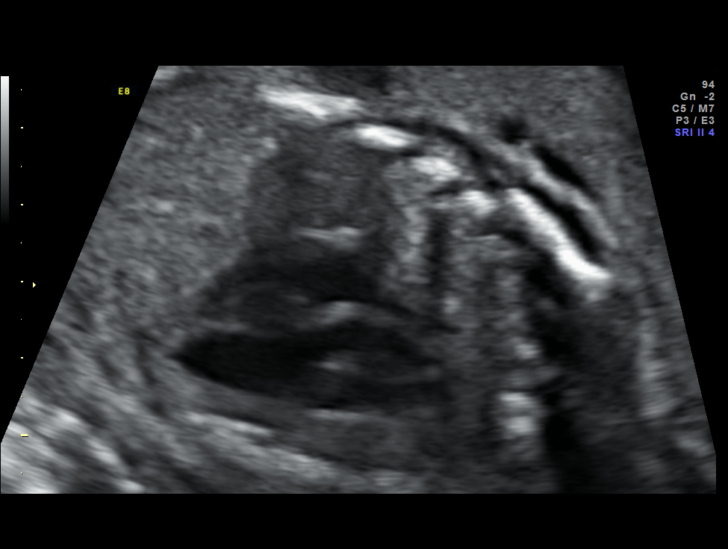
[im 67/73]
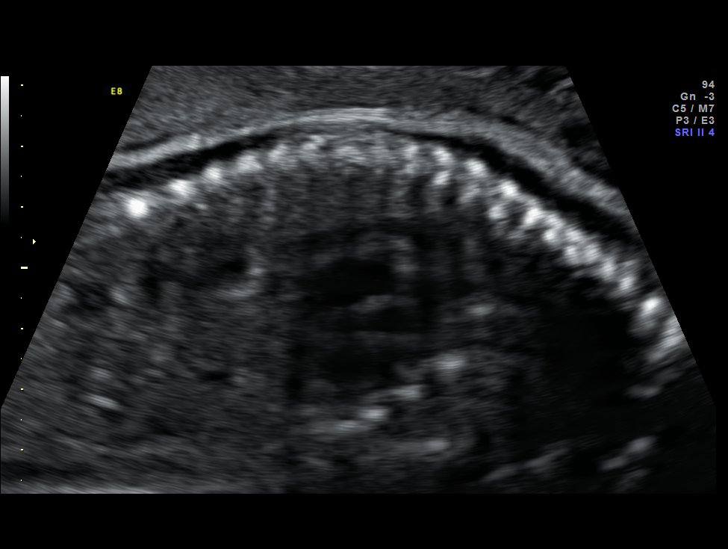
[im 73/73]
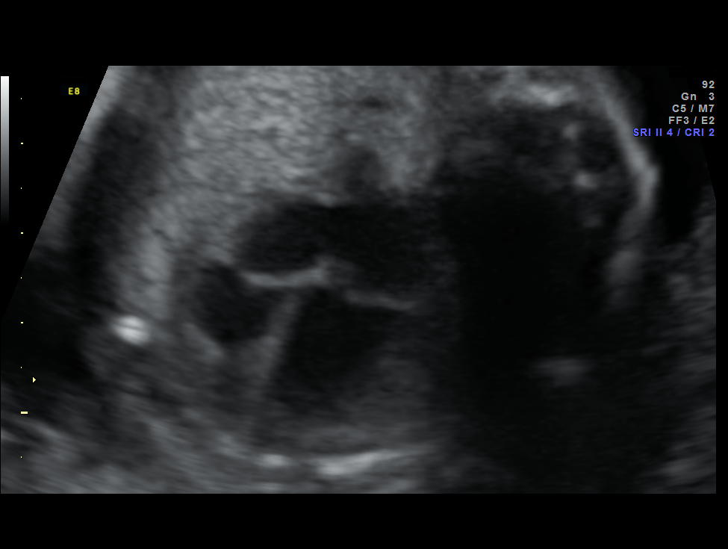

[14 of 28 positions shown; findings below may reference images not displayed]

Canned report from images found in remote index.

Refer to host system for actual result text.

## 2014-01-26 ENCOUNTER — Encounter (HOSPITAL_COMMUNITY): Payer: Self-pay | Admitting: Obstetrics & Gynecology

## 2016-02-03 ENCOUNTER — Telehealth: Payer: Self-pay | Admitting: *Deleted

## 2016-02-03 NOTE — Telephone Encounter (Signed)
Telephone call, requesting records from Dr. Eda PaschalGottsegen, conceived on second IUI without fertility medication. Has had at least 10 IUI's the last few years. 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

## 2016-02-03 NOTE — Telephone Encounter (Signed)
Pt lives in [place] asked if you would call her when you have a chance at [phone] she has a couple of questions about IUI. Please advise

## 2016-02-11 NOTE — Telephone Encounter (Signed)
Phone call, old records reviewed questions answered.
